# Patient Record
Sex: Female | Born: 1975 | Race: Asian | Hispanic: No | Marital: Married
Health system: Southern US, Community
[De-identification: ages and names within clinical notes are randomized; demographics above are authoritative.]

---

## 2018-03-03 ENCOUNTER — Other Ambulatory Visit: Payer: Self-pay | Admitting: Family Medicine

## 2018-03-03 DIAGNOSIS — Z1231 Encounter for screening mammogram for malignant neoplasm of breast: Secondary | ICD-10-CM

## 2018-03-04 ENCOUNTER — Ambulatory Visit
Admission: RE | Admit: 2018-03-04 | Discharge: 2018-03-04 | Disposition: A | Discharge status: Home / Self Care | Payer: BLUE CROSS/BLUE SHIELD | Source: Ambulatory Visit | Attending: Family Medicine | Admitting: Family Medicine

## 2018-03-04 DIAGNOSIS — Z1231 Encounter for screening mammogram for malignant neoplasm of breast: Secondary | ICD-10-CM

## 2018-03-05 ENCOUNTER — Other Ambulatory Visit: Payer: Self-pay | Admitting: Family Medicine

## 2018-03-05 DIAGNOSIS — R928 Other abnormal and inconclusive findings on diagnostic imaging of breast: Secondary | ICD-10-CM

## 2018-03-06 ENCOUNTER — Other Ambulatory Visit: Payer: Self-pay | Admitting: Obstetrics & Gynecology

## 2018-03-06 ENCOUNTER — Ambulatory Visit: Payer: Self-pay

## 2018-03-07 ENCOUNTER — Other Ambulatory Visit: Payer: Self-pay | Admitting: Family Medicine

## 2018-03-07 ENCOUNTER — Ambulatory Visit
Admission: RE | Admit: 2018-03-07 | Discharge: 2018-03-07 | Disposition: A | Discharge status: Home / Self Care | Payer: BLUE CROSS/BLUE SHIELD | Source: Ambulatory Visit | Attending: Family Medicine | Admitting: Family Medicine

## 2018-03-07 DIAGNOSIS — R928 Other abnormal and inconclusive findings on diagnostic imaging of breast: Secondary | ICD-10-CM

## 2018-03-07 DIAGNOSIS — R921 Mammographic calcification found on diagnostic imaging of breast: Secondary | ICD-10-CM

## 2018-03-07 DIAGNOSIS — R599 Enlarged lymph nodes, unspecified: Secondary | ICD-10-CM

## 2018-03-10 ENCOUNTER — Other Ambulatory Visit: Payer: BLUE CROSS/BLUE SHIELD

## 2018-03-10 ENCOUNTER — Other Ambulatory Visit: Payer: Self-pay | Admitting: Family Medicine

## 2018-03-10 DIAGNOSIS — R928 Other abnormal and inconclusive findings on diagnostic imaging of breast: Secondary | ICD-10-CM

## 2018-03-12 ENCOUNTER — Ambulatory Visit
Admission: RE | Admit: 2018-03-12 | Discharge: 2018-03-12 | Disposition: A | Discharge status: Home / Self Care | Payer: BLUE CROSS/BLUE SHIELD | Source: Ambulatory Visit | Attending: Family Medicine | Admitting: Family Medicine

## 2018-03-12 DIAGNOSIS — R928 Other abnormal and inconclusive findings on diagnostic imaging of breast: Secondary | ICD-10-CM

## 2018-03-12 MED ORDER — GADOBUTROL 1 MMOL/ML IV SOLN
7.0000 mL | Freq: Once | INTRAVENOUS | Status: AC | PRN
  Administered 2018-03-12: 7 mL via INTRAVENOUS

## 2018-03-15 ENCOUNTER — Other Ambulatory Visit: Payer: BLUE CROSS/BLUE SHIELD

## 2018-04-15 ENCOUNTER — Ambulatory Visit
Admission: RE | Admit: 2018-04-15 | Discharge: 2018-04-15 | Disposition: A | Discharge status: Home / Self Care | Payer: BLUE CROSS/BLUE SHIELD | Source: Ambulatory Visit | Attending: Family Medicine | Admitting: Family Medicine

## 2018-04-15 ENCOUNTER — Other Ambulatory Visit: Payer: Self-pay | Admitting: Family Medicine

## 2018-04-15 ENCOUNTER — Other Ambulatory Visit (HOSPITAL_COMMUNITY)
Admission: RE | Admit: 2018-04-15 | Disposition: A | Discharge status: Home / Self Care | Payer: BLUE CROSS/BLUE SHIELD | Source: Ambulatory Visit | Attending: Diagnostic Radiology | Admitting: Diagnostic Radiology

## 2018-04-15 DIAGNOSIS — R599 Enlarged lymph nodes, unspecified: Secondary | ICD-10-CM

## 2019-08-29 IMAGING — MR MR BILATERAL BREAST WITHOUT AND WITH CONTRAST
11 of 13 series · 32 of 48 positions shown · IV contrast (7ml gadavist)
Comparison: Previous exam(s).

CLINICAL DATA: High risk screening. Mammographically dense breast.
Recent diagnostic mammography and ultrasound was performed or a
possible right breast mass and left breast calcifications. Right
breast masses were shown to be cysts. Left breast calcifications
were felt to be most likely benign with short-term follow-up
recommended. There were prominent left axillary lymph nodes maximal
cortical thickness of 4.7 mm. Biopsy was recommended of 1 of the
prominent lymph nodes.

LABS:  Not drawn at time of imaging.
EXAM:
BILATERAL BREAST MRI WITH AND WITHOUT CONTRAST
TECHNIQUE: Multiplanar, multisequence MR images of both breasts were obtained
prior to and following the intravenous administration of 7 ml of
Gadavist

[Series 2: t2_tirm_tra ipat (a-p) · axial · 3.0mm · 0.70mm/px · 1 of 60 slices shown]
[im 1/60]
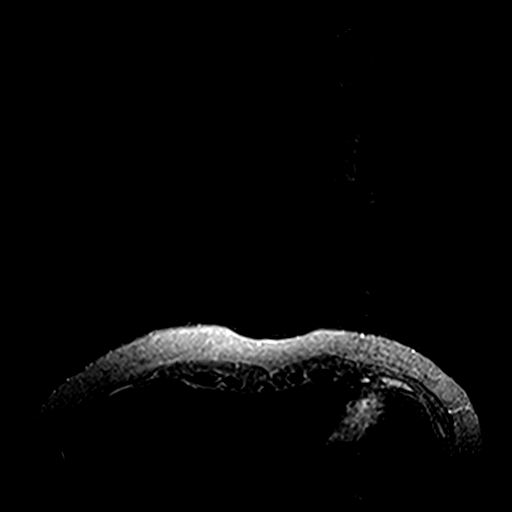

[Series 3: fl3d pre-cm no · axial · non-contrast · 1.2mm · 0.94mm/px · z∈[-84,+107]mm · 3 of 160 slices shown]
[im 1/160]
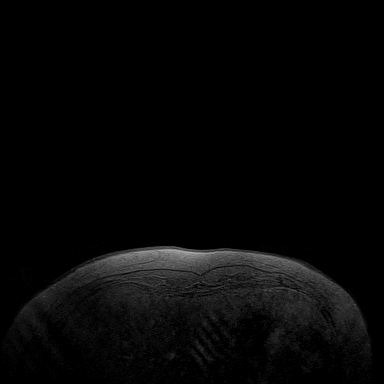
[im 80/160]
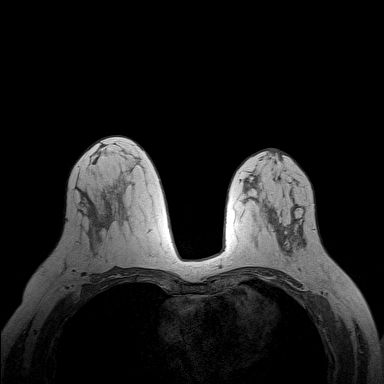
[im 160/160]
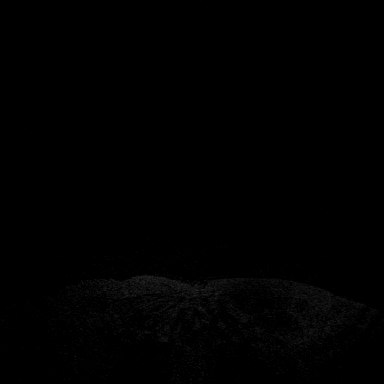

[Series 4: fl3d pre-cm · axial · non-contrast · 1.2mm · 0.94mm/px · z∈[-84,+107]mm · 3 of 160 slices shown]
[im 1/160]
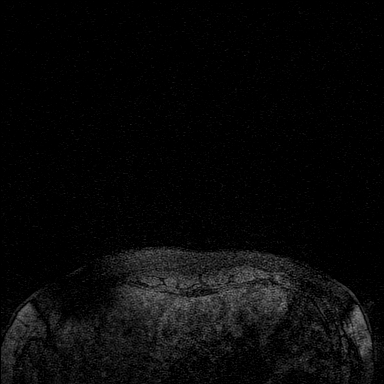
[im 80/160]
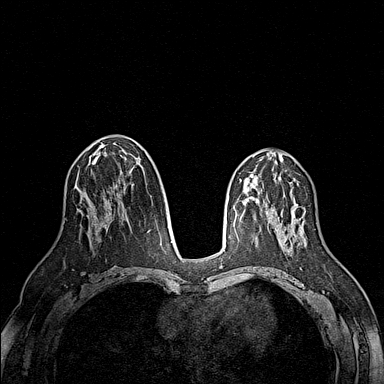
[im 160/160]
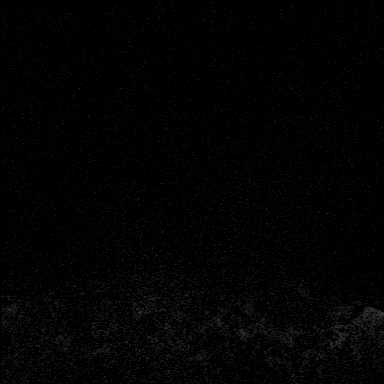

[Series 5: fl3d post-cm 20 · axial · 1.2mm · 0.94mm/px · z∈[-84,+107]mm · 3 of 160 slices shown (1 of 3)]
[im 1/160]
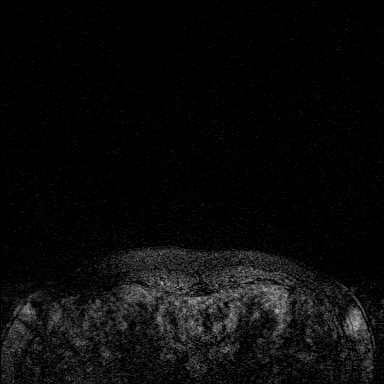
[im 80/160]
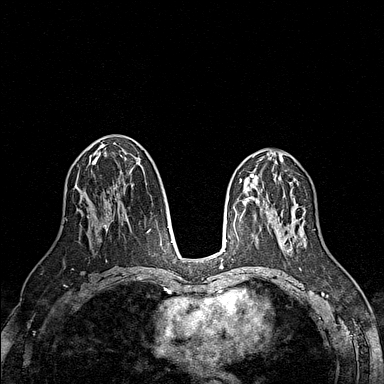
[im 160/160]
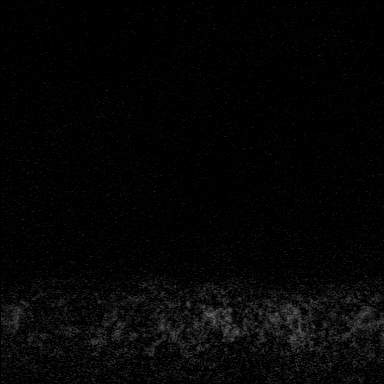

[Series 6: fl3d post-cm 20 · axial · 1.2mm · 0.94mm/px · z∈[-84,+107]mm · 4 of 160 slices shown (2 of 3)]
[im 1/160]
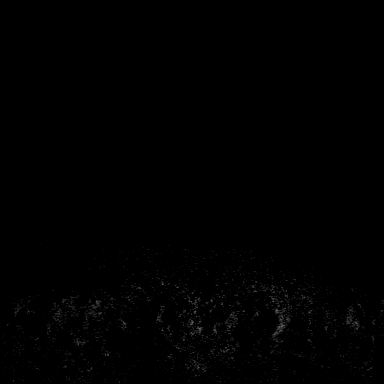
[im 54/160]
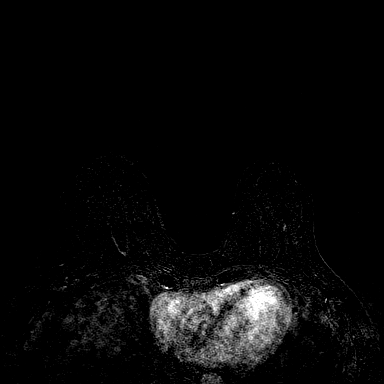
[im 107/160]
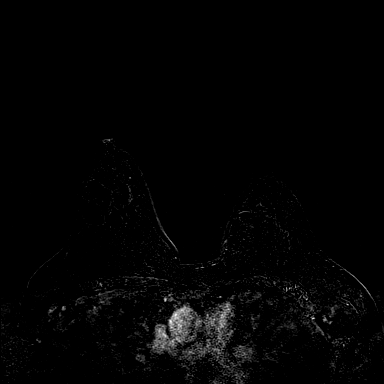
[im 160/160]
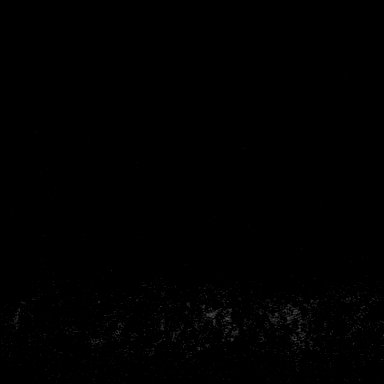

[Series 7: fl3d post-cm 20 · axial · 192.0mm · 0.94mm/px · 1 of 1 slices shown (3 of 3)]
[im 1/1]
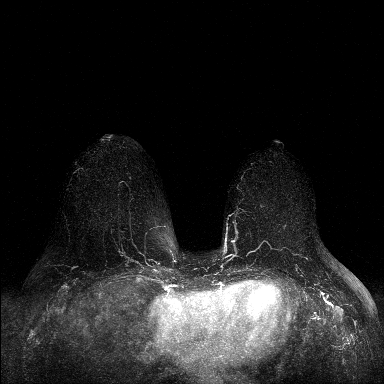

[Series 8: fl3d post-cm 3min · axial · 1.2mm · 0.94mm/px · z∈[-84,+107]mm · 4 of 160 slices shown]
[im 1/160]
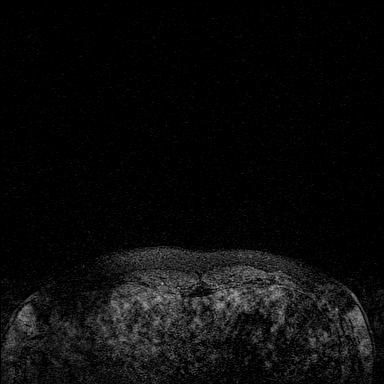
[im 54/160]
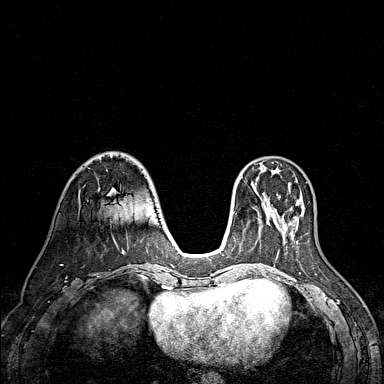
[im 107/160]
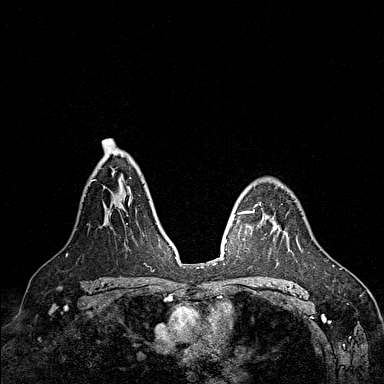
[im 160/160]
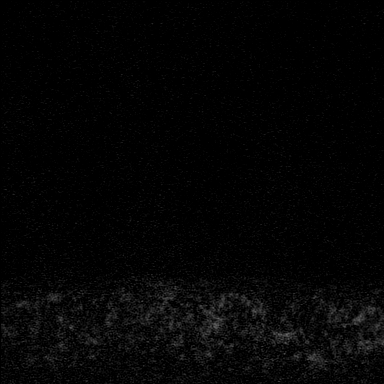

[Series 9: fl3d post-cm 3min_sub · axial · 1.2mm · 0.94mm/px · z∈[-84,+107]mm · 4 of 160 slices shown]
[im 1/160]
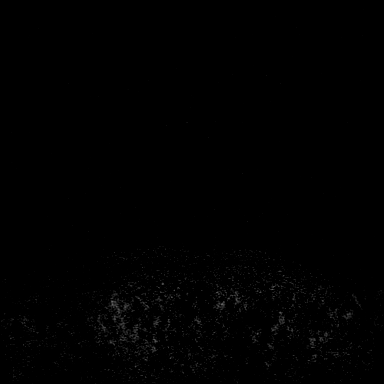
[im 54/160]
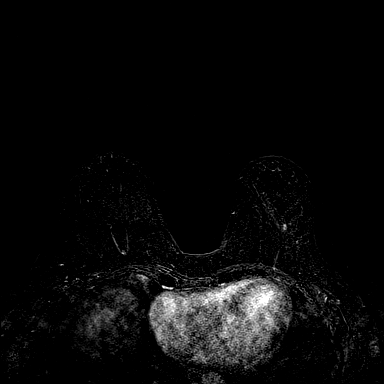
[im 107/160]
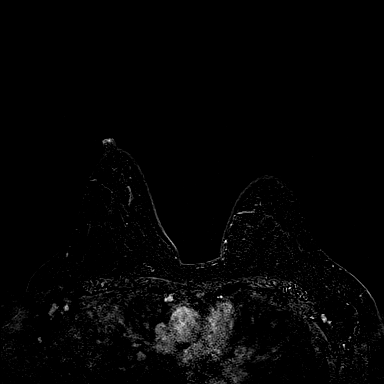
[im 160/160]
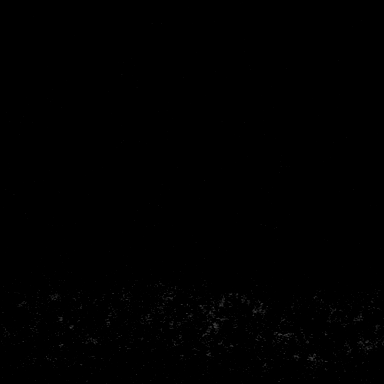

[Series 10: fl3d post-cm 3min_sub_mip_tra · axial · 192.0mm · 0.94mm/px · 1 of 1 slices shown]
[im 1/1]
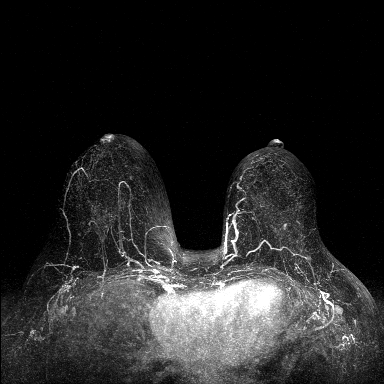

[Series 11: fl3d post-cm 5min · axial · 1.2mm · 0.94mm/px · z∈[-84,+107]mm · 4 of 160 slices shown]
[im 1/160]
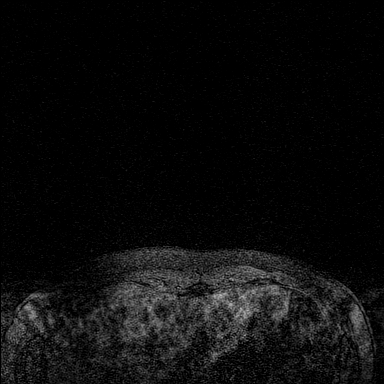
[im 54/160]
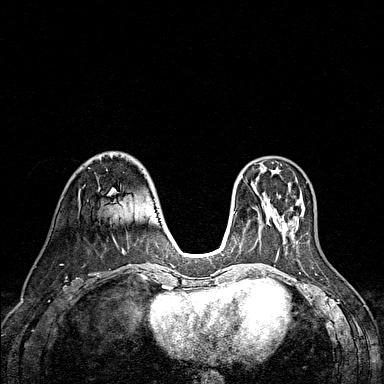
[im 107/160]
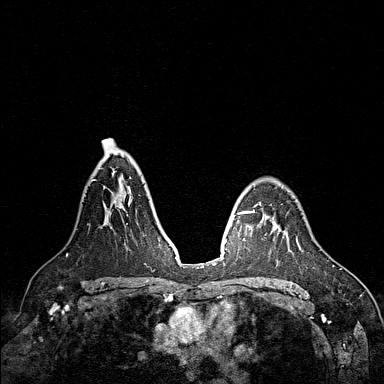
[im 160/160]
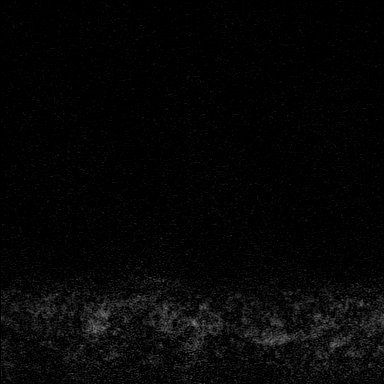

[Series 12: fl3d post-cm 5min_sub · axial · 1.2mm · 0.94mm/px · z∈[-84,+107]mm · 4 of 160 slices shown]
[im 1/160]
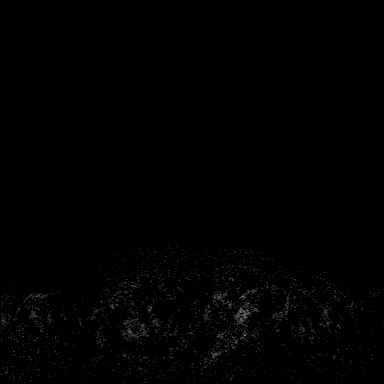
[im 54/160]
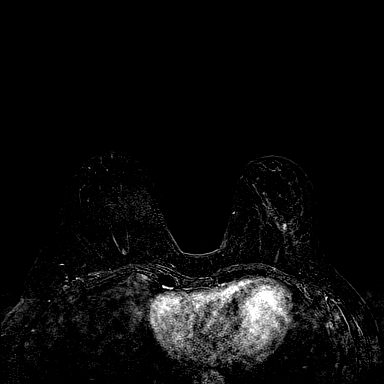
[im 107/160]
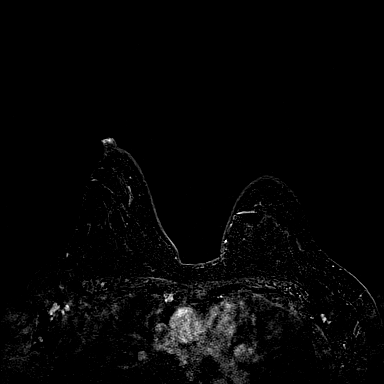
[im 160/160]
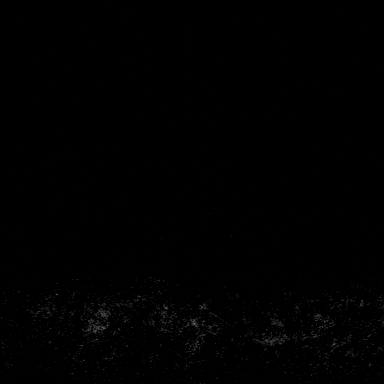

[32 of 48 positions shown; findings below may reference images not displayed]

Three-dimensional MR images were rendered by post-processing of the
original MR data on an independent workstation. The
three-dimensional MR images were interpreted, and findings are
reported in the following complete MRI report for this study. Three
dimensional images were evaluated at the independent [employer]
workstation
FINDINGS: Breast composition: c. Heterogeneous fibroglandular tissue.

Background parenchymal enhancement: Minimal

Right breast: No mass or abnormal enhancement.

Left breast: No mass or abnormal enhancement.

Lymph nodes: On the left, there are prominent lymph nodes as noted
on the recent left axillary ultrasound. Maximum cortical thickness
by MRI is 5 mm. Lymph nodes have normal overall morphologies.

Ancillary findings:  None.
IMPRESSION: 1. No abnormal enhancement in either breast to suggest breast
malignancy.
2. Prominent left axillary lymph nodes as noted on the recent left
axillary ultrasound. As noted on the prior diagnostic imaging report
from 03/07/2018, recommend ultrasound-guided core needle biopsy of
the node with the thickest cortex.
3. Six-month follow-up diagnostic mammography of the left breast for
the small cluster of calcifications.

RECOMMENDATION:
1. Ultrasound-guided core needle biopsy of 1 of the abnormal left
axillary lymph nodes, preferably the 1 with the thickest cortex.
Although the nodes are likely reactive, light of this patient's
family history, biopsy is recommended.
2. Six-month follow-up diagnostic left breast mammography for the
probably benign small group of calcifications.

BI-RADS CATEGORY  3: Probably benign.

## 2022-09-17 ENCOUNTER — Other Ambulatory Visit: Payer: Self-pay | Admitting: Family Medicine

## 2022-09-17 DIAGNOSIS — R0600 Dyspnea, unspecified: Secondary | ICD-10-CM

## 2022-09-17 NOTE — Progress Notes (Signed)
DOE for years. Brother died from unknown cardiac condition w/ similar presentation.  VSS and EKG reassuring.  Low ASCVD risk

## 2022-11-22 ENCOUNTER — Ambulatory Visit (HOSPITAL_COMMUNITY): Payer: 59

## 2022-12-05 ENCOUNTER — Ambulatory Visit (HOSPITAL_COMMUNITY): Admission: RE | Admit: 2022-12-05 | Payer: 59 | Source: Ambulatory Visit
